# Patient Record
Sex: Male | Born: 1995 | Race: White | Hispanic: No | Marital: Single | Smoking: Never smoker
Health system: Southern US, Community
[De-identification: ages and names within clinical notes are randomized; demographics above are authoritative.]

## PROBLEM LIST (undated history)

## (undated) DIAGNOSIS — J45909 Unspecified asthma, uncomplicated: Secondary | ICD-10-CM

---
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
## 2021-12-26 ENCOUNTER — Ambulatory Visit: Payer: BC Managed Care – PPO | Admitting: Allergy

## 2021-12-26 ENCOUNTER — Encounter: Payer: Self-pay | Admitting: Allergy

## 2021-12-26 ENCOUNTER — Other Ambulatory Visit: Payer: Self-pay | Admitting: Allergy

## 2021-12-26 VITALS — BP 128/70 | HR 78 | Temp 98.9°F | Resp 16 | Ht 75.0 in | Wt 183.0 lb

## 2021-12-26 DIAGNOSIS — J4530 Mild persistent asthma, uncomplicated: Secondary | ICD-10-CM | POA: Diagnosis not present

## 2021-12-26 DIAGNOSIS — H1013 Acute atopic conjunctivitis, bilateral: Secondary | ICD-10-CM

## 2021-12-26 DIAGNOSIS — J3089 Other allergic rhinitis: Secondary | ICD-10-CM | POA: Diagnosis not present

## 2021-12-26 MED ORDER — CARBINOXAMINE MALEATE 6 MG PO TABS: 6.0000 mg | ORAL_TABLET | Freq: Two times a day (BID) | ORAL | 5 refills | Status: DC

## 2021-12-26 MED ORDER — ALBUTEROL SULFATE HFA 108 (90 BASE) MCG/ACT IN AERS: 2.0000 | INHALATION_SPRAY | RESPIRATORY_TRACT | 1 refills | Status: AC | PRN

## 2021-12-26 MED ORDER — OLOPATADINE HCL 0.2 % OP SOLN: 1.0000 [drp] | Freq: Every day | OPHTHALMIC | 5 refills | Status: DC | PRN

## 2021-12-26 MED ORDER — RYALTRIS 665-25 MCG/ACT NA SUSP: 2.0000 | Freq: Two times a day (BID) | NASAL | 5 refills | Status: DC | PRN

## 2021-12-26 NOTE — Progress Notes (Signed)
New Patient Note  RE: Alexander Boyle MRN: [id number] DOB: April 07, 1996 Date of Office Visit: 12/26/2021  Primary care provider: Patient, No Pcp Per  Chief Complaint: Allergies  History of present illness: Alexander Boyle is a 26 y.o. male presenting today for evaluation of possible allergic rhinoconjunctivitis.  He reports symptoms of itchy/watery eyes, nasal congestion, runny nose, throat clearing, sneezing and trouble breathing.  The trouble breathing he is not sure if it is his allergies or his asthma but reports some wheezing and some tightness feeling in throat.  Symptoms are year-round but worse in the spring and summer.  He has tried variety of OTC medications.  Zyrtec works the best out of the antihistamines he has tired.  Has tried eye drops that are over-the-counter.  He has not really used any nasal sprays.  He is currently not tried nasal saline rinses.  He was diagnosed with asthma as a child.  He denies any hospitalizations for asthma.  He states physical activity use to trigger symptoms as a child but not much any longer.  As above allergies can trigger symptoms.  He has not had an albuterol inhaler or any inhaler in about past 5 years.  He has had times where he would like to have had an inhaler to use.  States will just rest and take deep breaths when he is having asthma symptoms. He did use Qvar as a child for maintenance.  He also recalls taking a chewable tablet, likely singulair, as a child.    He does work in Furniture conservator/restorer and has sting only had a few responses and is in areas like crawlspace often.  No history of eczema or food allergy.   Review of systems in the past 4 weeks: Review of Systems  Constitutional: Negative.   HENT:  Positive for congestion, postnasal drip and sneezing.   Eyes:  Positive for itching.  Respiratory: Negative.    Cardiovascular: Negative.   Musculoskeletal: Negative.   Skin: Negative.   Allergic/Immunologic: Negative.    Neurological: Negative.     All other systems negative unless noted above in HPI  Past medical history: Past Medical History:  Diagnosis Date   Asthma     Past surgical history: History reviewed. No pertinent surgical history.  Family history:  History reviewed. No pertinent family history.  Social history: Lives in an apartment with carpeting in the family room with electric heating and central cooling.  Cats in the home.  No concern for water damage, mildew or roaches in the home.  He is a Database administrator.  He denies a smoking history.   Medication List: Current Outpatient Medications  Medication Sig Dispense Refill   cetirizine (ZYRTEC) 10 MG tablet Take 1 tablet (10 mg total) by mouth daily. 30 tablet 3   albuterol (PROVENTIL HFA;VENTOLIN HFA) 108 (90 BASE) MCG/ACT inhaler Inhale 2 puffs into the lungs every 6 (six) hours as needed for wheezing or shortness of breath. (Patient not taking: Reported on 12/26/2021) 1 Inhaler 0   beclomethasone (QVAR) 80 MCG/ACT inhaler Inhale 2 puffs into the lungs 2 (two) times daily. (Patient not taking: Reported on 12/26/2021) 1 Inhaler 2   No current facility-administered medications for this visit.    Known medication allergies: No Known Allergies   Physical examination: Blood pressure 128/70, pulse 78, temperature 98.9 F (37.2 C), resp. rate 16, height 6\' 3"  (1.905 m), weight 183 lb (83 kg), SpO2 96 %.  General: Alert, interactive, in no acute distress.  HEENT: PERRLA, TMs pearly gray, turbinates mildly edematous without discharge, post-pharynx non erythematous. Neck: Supple without lymphadenopathy. Lungs: Clear to auscultation without wheezing, rhonchi or rales. {no increased work of breathing. CV: Normal S1, S2 without murmurs. Abdomen: Nondistended, nontender. Skin: Warm and dry, without lesions or rashes. Extremities:  No clubbing, cyanosis or edema. Neuro:   Grossly intact.  Diagnositics/Labs: Spirometry: FEV1: 3.8 L  70%, FVC: 4.5 L 69% predicted.  Status post albuterol he had a 10% change in FEV1 to 4.17 L or 77% or 370 mL change which is significant however did not quite normalize  Allergy testing:   Airborne Adult Perc - 12/26/21 1400     Time Antigen Placed 1448    Allergen Manufacturer Lavella Hammock    Location Back    Number of Test 59    1. Control-Buffer 50% Glycerol Negative    2. Control-Histamine 1 mg/ml 2+    3. Albumin saline Negative    4. [place] Negative    5. [place] Negative    6. Johnson Negative    7. [place] Blue Negative    8. Meadow Fescue Negative    9. Perennial Rye Negative    10. Sweet Vernal Negative    11. Timothy Negative    12. Cocklebur Negative    13. Burweed Marshelder Negative    14. Ragweed, short Negative    15. Ragweed, Giant Negative    16. Plantain,  English Negative    17. Lamb's Quarters Negative    18. Sheep Sorrell Negative    19. Rough Pigweed Negative    20. Marsh Elder, Rough Negative    21. Mugwort, Common Negative    22. Ash mix Negative    23. Birch mix Negative    24. Beech American Negative    25. Box, Elder Negative    26. Cedar, red Negative    27. Cottonwood, [place] Negative    28. Elm mix Negative    29. Hickory Negative    30. Maple mix Negative    31. Oak, [place] mix Negative    32. Pecan Pollen Negative    33. Pine mix Negative    34. Sycamore Eastern Negative    35. [place], Black Pollen Negative    36. Alternaria alternata Negative    37. Cladosporium Herbarum Negative    38. Aspergillus mix Negative    39. Penicillium mix Negative    40. Bipolaris sorokiniana (Helminthosporium) Negative    41. Drechslera spicifera (Curvularia) Negative    42. Mucor plumbeus Negative    43. Fusarium moniliforme Negative    44. Aureobasidium pullulans (pullulara) Negative    45. Rhizopus oryzae Negative    46. Botrytis cinera Negative    47. Epicoccum nigrum Negative    48. Phoma betae Negative    49. Candida Albicans Negative    50.  Trichophyton mentagrophytes Negative    51. Mite, D Farinae  5,000 AU/ml Negative    52. Mite, D Pteronyssinus  5,000 AU/ml Negative    53. Cat Hair 10,000 BAU/ml Negative    54.  Dog Epithelia Negative    55. Mixed Feathers Negative    56. Horse Epithelia Negative    57. Cockroach, German Negative    58. Mouse Negative    59. Tobacco Leaf Negative             Intradermal - 12/26/21 1521     Time Antigen Placed 1520    Allergen Manufacturer Lavella Hammock    Location Arm  Number of Test 15    Control Negative    [place] Negative    Johnson Negative    7 Grass Negative    Ragweed mix 2+    Weed mix 2+    Tree mix 2+    Mold 1 Negative    Mold 2 2+    Mold 3 2+    Mold 4 2+    Cat 4+    Dog 4+    Cockroach 3+    Mite mix 4+             Allergy testing results were read and interpreted by provider, documented by clinical staff.   Assessment and plan: Allergic rhinitis with conjunctivitis  - Testing today showed: weeds, trees, indoor molds, outdoor molds, dust mites, cat, dog, and cockroach. - Copy of test results provided.  - Avoidance measures provided. - Stop taking: your allergy based antihistamines - Start taking: Ryvent (carbinoxamine) 6mg  tablet 2 times daily.  This is a prescription based antihistamine.   Ryaltris (olopatadine/mometasone) two sprays per nostril 1-2 times daily for nasal congestion or drainage control.  Sample provided.  Pataday (olopatadine) one drop per eye daily as needed for itchy/watery eyes.  - You can use an extra dose of the antihistamine, if needed, for breakthrough symptoms.  - Consider nasal saline rinses 1-2 times daily to remove allergens from the nasal cavities as well as help with mucous clearance (this is especially helpful to do before the nasal sprays are given) - Consider allergy shots as a means of long-term control if medication management is not effective enough. - Allergy shots "re-train" and "reset" the immune system to  ignore environmental allergens and decrease the resulting immune response to those allergens (sneezing, itchy watery eyes, runny nose, nasal congestion, etc).    - Allergy shots improve symptoms in 80-85% of patients.   Mild persistent asthma - Daily controller medication(s): none at this time - Prior to physical activity: albuterol 2 puffs 10-15 minutes before physical activity. - Rescue medications: albuterol 2 puffs every 4-6 hours as needed  - Asthma control goals:  * Full participation in all desired activities (may need albuterol before activity) * Albuterol use two time or less a week on average (not counting use with activity) * Cough interfering with sleep two time or less a month * Oral steroids no more than once a year * No hospitalizations  Follow-up in 3-4 months or sooner if needed  I appreciate the opportunity to take part in Ray[place]'s care. Please do not hesitate to contact me with questions.  Sincerely,   03-10-1987, MD Allergy/Immunology [facility]

## 2021-12-26 NOTE — Patient Instructions (Signed)
-   Testing today showed: weeds, trees, indoor molds, outdoor molds, dust mites, cat, dog, and cockroach. - Copy of test results provided.  - Avoidance measures provided. - Stop taking: your allergy based antihistamines - Start taking: Ryvent (carbinoxamine) 6mg  tablet 2 times daily.  This is a prescription based antihistamine.   Ryaltris (olopatadine/mometasone) two sprays per nostril 1-2 times daily for nasal congestion or drainage control.  Sample provided.  Pataday (olopatadine) one drop per eye daily as needed for itchy/watery eyes.  - You can use an extra dose of the antihistamine, if needed, for breakthrough symptoms.  - Consider nasal saline rinses 1-2 times daily to remove allergens from the nasal cavities as well as help with mucous clearance (this is especially helpful to do before the nasal sprays are given) - Consider allergy shots as a means of long-term control if medication management is not effective enough. - Allergy shots "re-train" and "reset" the immune system to ignore environmental allergens and decrease the resulting immune response to those allergens (sneezing, itchy watery eyes, runny nose, nasal congestion, etc).    - Allergy shots improve symptoms in 80-85% of patients.   - Daily controller medication(s): none at this time - Prior to physical activity: albuterol 2 puffs 10-15 minutes before physical activity. - Rescue medications: albuterol 2 puffs every 4-6 hours as needed  - Asthma control goals:  * Full participation in all desired activities (may need albuterol before activity) * Albuterol use two time or less a week on average (not counting use with activity) * Cough interfering with sleep two time or less a month * Oral steroids no more than once a year * No hospitalizations  Follow-up in 3-4 months or sooner if needed

## 2022-04-26 ENCOUNTER — Ambulatory Visit: Payer: BC Managed Care – PPO | Admitting: Allergy

## 2022-04-26 DIAGNOSIS — J309 Allergic rhinitis, unspecified: Secondary | ICD-10-CM

## 2022-04-29 ENCOUNTER — Other Ambulatory Visit: Payer: Self-pay | Admitting: Chiropractic Medicine

## 2022-04-29 ENCOUNTER — Ambulatory Visit
Admission: RE | Admit: 2022-04-29 | Discharge: 2022-04-29 | Disposition: A | Discharge status: Home / Self Care | Payer: BC Managed Care – PPO | Source: Ambulatory Visit | Attending: Chiropractic Medicine | Admitting: Chiropractic Medicine

## 2022-04-29 DIAGNOSIS — M542 Cervicalgia: Secondary | ICD-10-CM

## 2022-04-29 DIAGNOSIS — M549 Dorsalgia, unspecified: Secondary | ICD-10-CM

## 2022-06-07 ENCOUNTER — Encounter: Payer: Self-pay | Admitting: Allergy

## 2022-06-07 ENCOUNTER — Ambulatory Visit: Payer: BC Managed Care – PPO | Admitting: Allergy

## 2022-06-07 ENCOUNTER — Other Ambulatory Visit: Payer: Self-pay

## 2022-06-07 VITALS — BP 108/64 | HR 64 | Temp 98.1°F | Resp 16

## 2022-06-07 DIAGNOSIS — J4530 Mild persistent asthma, uncomplicated: Secondary | ICD-10-CM | POA: Diagnosis not present

## 2022-06-07 DIAGNOSIS — H1013 Acute atopic conjunctivitis, bilateral: Secondary | ICD-10-CM

## 2022-06-07 DIAGNOSIS — J3089 Other allergic rhinitis: Secondary | ICD-10-CM | POA: Diagnosis not present

## 2022-06-07 MED ORDER — VENTOLIN HFA 108 (90 BASE) MCG/ACT IN AERS: 2.0000 | INHALATION_SPRAY | RESPIRATORY_TRACT | 1 refills | Status: DC | PRN

## 2022-06-07 MED ORDER — CARBINOXAMINE MALEATE 4 MG PO TABS: 1.0000 | ORAL_TABLET | Freq: Two times a day (BID) | ORAL | 5 refills | Status: DC

## 2022-06-07 MED ORDER — OLOPATADINE HCL 0.2 % OP SOLN: 1.0000 [drp] | Freq: Every day | OPHTHALMIC | 5 refills | Status: DC | PRN

## 2022-06-07 MED ORDER — RYALTRIS 665-25 MCG/ACT NA SUSP: 2.0000 | Freq: Two times a day (BID) | NASAL | 5 refills | Status: DC | PRN

## 2022-06-07 NOTE — Patient Instructions (Signed)
-  Continue avoidance measures for weeds, trees, indoor molds, outdoor molds, dust mites, cat, dog, and cockroach. - Start taking: Ryvent (carbinoxamine) 6mg  tablet 2 times daily.  This is a prescription based antihistamine.   Ryaltris (olopatadine/mometasone) two sprays per nostril 1-2 times daily for nasal congestion or drainage control.  Sample provided.  Pataday (olopatadine) one drop per eye daily as needed for itchy/watery eyes.  - You can use an extra dose of the antihistamine, if needed, for breakthrough symptoms.  - Consider nasal saline rinses 1-2 times daily to remove allergens from the nasal cavities as well as help with mucous clearance (this is especially helpful to do before the nasal sprays are given) - Consider allergy shots as a means of long-term control if medication management is not effective enough. - Allergy shots "re-train" and "reset" the immune system to ignore environmental allergens and decrease the resulting immune response to those allergens (sneezing, itchy watery eyes, runny nose, nasal congestion, etc).    - Allergy shots improve symptoms in 80-85% of patients.   - Daily controller medication(s): none at this time - Prior to physical activity: albuterol 2 puffs 10-15 minutes before physical activity. - Rescue medications: albuterol 2 puffs every 4-6 hours as needed  - Asthma control goals:  * Full participation in all desired activities (may need albuterol before activity) * Albuterol use two time or less a week on average (not counting use with activity) * Cough interfering with sleep two time or less a month * Oral steroids no more than once a year * No hospitalizations  Follow-up in 6 months or sooner if needed

## 2022-06-07 NOTE — Progress Notes (Signed)
Follow-up Note  RE: Alexander Boyle MRN: [id number] DOB: 1996-03-03 Date of Office Visit: 06/07/2022   History of present illness: Alexander Boyle is a 27 y.o. male presenting today for follow-up of allergic rhinitis with conjunctivitis and asthma.  He was last seen in the office on 12/26/21 by myself.  After this visit we sent in the medications to pharmacy however there was delay in getting them and thus appears pharmacy restocked the prescriptions.  Thus he has not had them to try thus far.  I had recommended use of ruvent primarily.  We were able to get him a sample of the ryaltris which he did use the sample and states it did help and he would wake up with less congestion.  The sample ran out.  Also recommended pataday for as needed use but he states has not had a lot of itchy/watery eyes.  With his asthma he states he only had 1 episodes since last visit where he had shortness of breath and he just slowed down what every he was doing to catch his breath and in several minutes he had some resolution.  He states he does not have albuterol.  He has not had any ED/UC visits or systemic steroid needs.   Review of systems: Review of Systems  Constitutional: Negative.   HENT: Negative.    Eyes: Negative.   Respiratory: Negative.    Cardiovascular: Negative.   Musculoskeletal: Negative.   Skin: Negative.   Allergic/Immunologic: Negative.   Neurological: Negative.      All other systems negative unless noted above in HPI  Past medical/social/surgical/family history have been reviewed and are unchanged unless specifically indicated below.  No changes  Medication List: Current Outpatient Medications  Medication Sig Dispense Refill   VENTOLIN HFA 108 (90 Base) MCG/ACT inhaler Inhale 2 puffs into the lungs every 4 (four) hours as needed for wheezing or shortness of breath. 18 g 1   albuterol (VENTOLIN HFA) 108 (90 Base) MCG/ACT inhaler Inhale 2 puffs into the lungs every 4 (four) hours  as needed for wheezing or shortness of breath. (Patient not taking: Reported on 06/07/2022) 18 g 1   beclomethasone (QVAR) 80 MCG/ACT inhaler Inhale 2 puffs into the lungs 2 (two) times daily. (Patient not taking: Reported on 12/26/2021) 1 Inhaler 2   Carbinoxamine Maleate 4 MG TABS Take 1 tablet (4 mg total) by mouth 2 (two) times daily. 60 tablet 5   Olopatadine HCl 0.2 % SOLN Apply 1 drop to eye daily as needed (Itchy or watery eyes). 2.5 mL 5   RYALTRIS 665-25 MCG/ACT SUSP Place 2 sprays into the nose 2 (two) times daily as needed (Runny or stuffy nose). 29 g 5   No current facility-administered medications for this visit.     Known medication allergies: No Known Allergies   Physical examination: Blood pressure 108/64, pulse 64, temperature 98.1 F (36.7 C), temperature source Temporal, resp. rate 16, SpO2 95 %.  General: Alert, interactive, in no acute distress. HEENT: PERRLA, TMs pearly gray, turbinates moderately edematous without discharge, post-pharynx non erythematous. Neck: Supple without lymphadenopathy. Lungs: Clear to auscultation without wheezing, rhonchi or rales. {no increased work of breathing. CV: Normal S1, S2 without murmurs. Abdomen: Nondistended, nontender. Skin: Warm and dry, without lesions or rashes. Extremities:  No clubbing, cyanosis or edema. Neuro:   Grossly intact.  Diagnositics/Labs: None today  Assessment and plan: Allergic rhinitis with conjunctivitis - Continue avoidance measures for weeds, trees, indoor molds, outdoor molds, dust  mites, cat, dog, and cockroach. - Start taking: Ryvent (carbinoxamine) 6mg  tablet 2 times daily.  This is a prescription based antihistamine.   Ryaltris (olopatadine/mometasone) two sprays per nostril 1-2 times daily for nasal congestion or drainage control.  Sample provided.  Pataday (olopatadine) one drop per eye daily as needed for itchy/watery eyes.  - You can use an extra dose of the antihistamine, if needed, for  breakthrough symptoms.  - Consider nasal saline rinses 1-2 times daily to remove allergens from the nasal cavities as well as help with mucous clearance (this is especially helpful to do before the nasal sprays are given) - Consider allergy shots as a means of long-term control if medication management is not effective enough. - Allergy shots "re-train" and "reset" the immune system to ignore environmental allergens and decrease the resulting immune response to those allergens (sneezing, itchy watery eyes, runny nose, nasal congestion, etc).    - Allergy shots improve symptoms in 80-85% of patients.   Mild persistent asthma - Daily controller medication(s): none at this time - Prior to physical activity: albuterol 2 puffs 10-15 minutes before physical activity. - Rescue medications: albuterol 2 puffs every 4-6 hours as needed  - Asthma control goals:  * Full participation in all desired activities (may need albuterol before activity) * Albuterol use two time or less a week on average (not counting use with activity) * Cough interfering with sleep two time or less a month * Oral steroids no more than once a year * No hospitalizations  Follow-up in 6 months or sooner if needed  I appreciate the opportunity to take part in Alexander Boyle's care. Please do not hesitate to contact me with questions.  Sincerely,   Prudy Feeler, MD Allergy/Immunology [facility]

## 2022-12-06 ENCOUNTER — Ambulatory Visit: Payer: BC Managed Care – PPO | Admitting: Allergy

## 2023-02-22 ENCOUNTER — Other Ambulatory Visit: Payer: Self-pay | Admitting: Allergy

## 2023-05-26 NOTE — Patient Instructions (Addendum)
 Allergic rhinitis with conjunctivitis - Your rapid Covid-19 test today is negative. Discussed how symptoms are likely viral at this point with symptoms starting Sunday. If symptoms worsen or if you develop a fever let us  know. If sore throat persists/worsens recommend going to urgent care for strep throat testing - Continue avoidance measures for weeds, trees, indoor molds, outdoor molds, dust mites, cat, dog, and cockroach. - continue  (carbinoxamine ) 4mg  tablet 2 times daily.  This is a prescription based antihistamine.    -Start Flonase  (fluticasone ) 1-2 sprays in each nostril once a day as needed for stuffy nose. If insurance does not cover this you can buy it over the counter - Start azelastine  nasal spray 1-2 sprays in each nostril once to twice a day as needed for drainage down throat/runny nose. If insurance does not cover this medication you can buy it over the counter Pataday  (olopatadine ) one drop per eye daily as needed for itchy/watery eyes.  - You can use an extra dose of the antihistamine, if needed, for breakthrough symptoms.  - Consider nasal saline rinses 1-2 times daily to remove allergens from the nasal cavities as well as help with mucous clearance (this is especially helpful to do before the nasal sprays are given) - Consider allergy  shots as a means of long-term control if medication management is not effective enough. - Allergy  shots re-train and reset the immune system to ignore environmental allergens and decrease the resulting immune response to those allergens (sneezing, itchy watery eyes, runny nose, nasal congestion, etc).    - Allergy  shots improve symptoms in 80-85% of patients.   Mild persistent asthma - Daily controller medication(s): none at this time - Prior to physical activity: albuterol  2 puffs 10-15 minutes before physical activity. - Rescue medications: albuterol  2 puffs every 4-6 hours as needed  - Asthma control goals:  * Full participation in all  desired activities (may need albuterol  before activity) * Albuterol  use two time or less a week on average (not counting use with activity) * Cough interfering with sleep two time or less a month * Oral steroids no more than once a year * No hospitalizations  Follow-up in 4-6 months or sooner if needed

## 2023-05-27 ENCOUNTER — Ambulatory Visit: Payer: BC Managed Care – PPO | Admitting: Family

## 2023-05-27 ENCOUNTER — Other Ambulatory Visit: Payer: Self-pay

## 2023-05-27 ENCOUNTER — Encounter: Payer: Self-pay | Admitting: Family

## 2023-05-27 VITALS — BP 120/60 | HR 72 | Temp 98.6°F | Resp 16 | Ht 73.0 in | Wt 184.4 lb

## 2023-05-27 DIAGNOSIS — J3089 Other allergic rhinitis: Secondary | ICD-10-CM

## 2023-05-27 DIAGNOSIS — J4530 Mild persistent asthma, uncomplicated: Secondary | ICD-10-CM | POA: Diagnosis not present

## 2023-05-27 DIAGNOSIS — R52 Pain, unspecified: Secondary | ICD-10-CM

## 2023-05-27 DIAGNOSIS — R0981 Nasal congestion: Secondary | ICD-10-CM

## 2023-05-27 DIAGNOSIS — H1013 Acute atopic conjunctivitis, bilateral: Secondary | ICD-10-CM

## 2023-05-27 MED ORDER — VENTOLIN HFA 108 (90 BASE) MCG/ACT IN AERS: 2.0000 | INHALATION_SPRAY | RESPIRATORY_TRACT | 1 refills | Status: AC | PRN

## 2023-05-27 MED ORDER — FLUTICASONE PROPIONATE 50 MCG/ACT NA SUSP: NASAL | 5 refills | Status: AC

## 2023-05-27 MED ORDER — AZELASTINE HCL 0.15 % NA SOLN: NASAL | 2 refills | Status: AC

## 2023-05-27 MED ORDER — CARBINOXAMINE MALEATE 4 MG PO TABS: 1.0000 | ORAL_TABLET | Freq: Two times a day (BID) | ORAL | 5 refills | Status: DC

## 2023-05-27 MED ORDER — OLOPATADINE HCL 0.2 % OP SOLN: 1.0000 [drp] | Freq: Every day | OPHTHALMIC | 5 refills | Status: AC | PRN

## 2023-05-27 NOTE — Progress Notes (Signed)
 [address] [place] [address] Dept: [phone]  FOLLOW UP NOTE  Patient ID: Alexander Boyle, male    DOB: 12-Apr-1996  Age: 28 y.o. MRN: [id number] Date of Office Visit: 05/27/2023  Assessment  Chief Complaint: Follow-up (Only has to use in inhaler for 1 to 2 times in the last 6 months. States he has a sinus infection currently.) and Medication Refill  HPI Alexander Boyle is a 28 year old male who presents today for follow-up of allergic rhinitis with conjunctivitis and mild persistent asthma.  He was last seen on June 07, 2022 by Dr. Jeneal.  He denies any new diagnosis or surgeries since his last office visit.  His wife is here with him today.  Allergic rhinitis with conjunctivitis: He reports since Sunday he has had stuffy nose, sore throat, sneezing, body aches, and a cough due to feeling something in his throat.  He has not been treated for any sinus infections since we last saw him.  He also reports chills.  His highest fever was this morning at 99 F.  He denies rhinorrhea and postnasal drip.  He has not checked for COVID-19.  He does not have any known sick contacts.  He wonders if he has a sinus infection.  He is currently using carbinoxamine  as needed does not have any nose sprays.  He was just given a sample of the Ryaltris  nasal spray.  He reports itchy watery eyes at times.  He only wears glasses.  Mild persistent asthma: He reports a dry cough due to something in his throat.  He denies wheezing, tightness in his chest, and shortness of breath.  He also reports not being able to breathe at night due to his nose being stopped up.  Since his last office visit he has not required any systemic steroids or made any trips to the emergency room or urgent care due to breathing problems.  He reports very rare use of his albuterol .   Drug Allergies:  No Known Allergies  Review of Systems: Negative except as per HPI   Physical Exam: BP 120/60   Pulse 72   Temp 98.6 F  (37 C) (Axillary)   Resp 16   Ht 6' 1 (1.854 m)   Wt 184 lb 6.4 oz (83.6 kg)   SpO2 96%   BMI 24.33 kg/m    Physical Exam Chaperone present: wife present.  Constitutional:      Appearance: Normal appearance.  HENT:     Head: Normocephalic and atraumatic.     Comments: Pharynx normal. Eyes normal. Ears normal. Nose: bilateral lower turbinates mildly edematous with no drainage noted.    Right Ear: Tympanic membrane, ear canal and external ear normal.     Left Ear: Tympanic membrane, ear canal and external ear normal.     Mouth/Throat:     Mouth: Mucous membranes are moist.     Pharynx: Oropharynx is clear.  Eyes:     Conjunctiva/sclera: Conjunctivae normal.  Cardiovascular:     Rate and Rhythm: Regular rhythm.     Heart sounds: Normal heart sounds.  Pulmonary:     Effort: Pulmonary effort is normal.     Breath sounds: Normal breath sounds.     Comments: Lungs clear to auscultation Musculoskeletal:     Cervical back: Neck supple.  Skin:    General: Skin is warm.  Neurological:     Mental Status: He is alert and oriented to person, place, and time.  Psychiatric:  Mood and Affect: Mood normal.        Behavior: Behavior normal.        Thought Content: Thought content normal.        Judgment: Judgment normal.     Diagnostics: Your rapid Covid-19 test today was negative  Will get spirometry at next office visit due to symptoms  Assessment and Plan: 1. Non-seasonal allergic rhinitis due to other allergic trigger   2. Mild persistent asthma, uncomplicated   3. Allergic conjunctivitis of both eyes   4. Body aches   5. Nasal congestion     Meds ordered this encounter  Medications   VENTOLIN  HFA 108 (90 Base) MCG/ACT inhaler    Sig: Inhale 2 puffs into the lungs every 4 (four) hours as needed for wheezing or shortness of breath.    Dispense:  18 g    Refill:  1   Olopatadine  HCl 0.2 % SOLN    Sig: Apply 1 drop to eye daily as needed (Itchy or watery eyes).     Dispense:  2.5 mL    Refill:  5   Carbinoxamine  Maleate 4 MG TABS    Sig: Take 1 tablet (4 mg total) by mouth 2 (two) times daily.    Dispense:  60 tablet    Refill:  5   Azelastine  HCl 0.15 % SOLN    Sig: Use 1-2 sprays in each nostril once to twice a day as needed for runny nose/drainage down throat.    Dispense:  30 mL    Refill:  2   fluticasone  (FLONASE ) 50 MCG/ACT nasal spray    Sig: Place 1-2 sprays in each nostril once a day as needed for stuffy nose    Dispense:  16 g    Refill:  5    Patient Instructions  Allergic rhinitis with conjunctivitis - Your rapid Covid-19 test today is negative. Discussed how symptoms are likely viral at this point with symptoms starting Sunday. If symptoms worsen or if you develop a fever let us  know. If sore throat persists/worsens recommend going to urgent care for strep throat testing - Continue avoidance measures for weeds, trees, indoor molds, outdoor molds, dust mites, cat, dog, and cockroach. - continue  (carbinoxamine ) 4mg  tablet 2 times daily.  This is a prescription based antihistamine.    -Start Flonase  (fluticasone ) 1-2 sprays in each nostril once a day as needed for stuffy nose. If insurance does not cover this you can buy it over the counter - Start azelastine  nasal spray 1-2 sprays in each nostril once to twice a day as needed for drainage down throat/runny nose. If insurance does not cover this medication you can buy it over the counter Pataday  (olopatadine ) one drop per eye daily as needed for itchy/watery eyes.  - You can use an extra dose of the antihistamine, if needed, for breakthrough symptoms.  - Consider nasal saline rinses 1-2 times daily to remove allergens from the nasal cavities as well as help with mucous clearance (this is especially helpful to do before the nasal sprays are given) - Consider allergy  shots as a means of long-term control if medication management is not effective enough. - Allergy  shots re-train and  reset the immune system to ignore environmental allergens and decrease the resulting immune response to those allergens (sneezing, itchy watery eyes, runny nose, nasal congestion, etc).    - Allergy  shots improve symptoms in 80-85% of patients.   Mild persistent asthma - Daily controller medication(s): none at this time -  Prior to physical activity: albuterol  2 puffs 10-15 minutes before physical activity. - Rescue medications: albuterol  2 puffs every 4-6 hours as needed  - Asthma control goals:  * Full participation in all desired activities (may need albuterol  before activity) * Albuterol  use two time or less a week on average (not counting use with activity) * Cough interfering with sleep two time or less a month * Oral steroids no more than once a year * No hospitalizations  Follow-up in 4-6 months or sooner if needed  Return in about 6 months (around 11/24/2023), or if symptoms worsen or fail to improve.    Thank you for the opportunity to care for this patient.  Please do not hesitate to contact me with questions.  Wanda Craze, FNP [facility] 

## 2023-11-24 ENCOUNTER — Ambulatory Visit: Payer: BC Managed Care – PPO | Admitting: Family Medicine

## 2023-12-03 NOTE — Progress Notes (Signed)
 [address] [place] [address] Dept: [phone]  FOLLOW UP NOTE  Patient ID: Alexander Boyle, male    DOB: 1995/11/28  Age: 28 y.o. MRN: [id number] Date of Office Visit: 12/04/2023  Assessment  Chief Complaint: Allergic Rhinitis   HPI Alexander Boyle is a 28 year old male who presents to the clinic for a follow-up visit.  He was last seen in this clinic on 05/27/2023 by Wanda Craze, FNP, for evaluation of asthma, allergic rhinitis, and allergic conjunctivitis.  His his last environmental allergy  skin testing on 12/26/2021 was positive to weed pollen, tree pollen, indoor mold, outdoor mold, dust mite, cat, dog, and cockroach.  At today's visit, he reports his asthma has been well-controlled with occasional shortness of breath occurring mainly when the pollen is heavy in the springtime.  He continues albuterol  as needed and reports that he has not used this medication for about 2 months.  He reports relief of symptoms with albuterol  use.  Allergic rhinitis is reported as moderately well-controlled with occasional symptoms  including nasal congestion and occasional rhinorrhea.  He continues carbinoxamine  4 mg twice a day and occasionally uses Flonase  and azelastine  with relief of symptoms.  He reports his allergies are aggravated in the springtime.  We briefly discussed allergy  injections and he reports that he feels satisfied with his current medication regimen.  Allergic conjunctivitis is reported as well-controlled with occasional red and itchy eyes for which he uses an over-the-counter allergy  eyedrop if needed with relief of symptoms.  His current medications are listed in the chart.  Drug Allergies:  No Known Allergies  Physical Exam: BP 118/62 (BP Location: Left Arm, Patient Position: Sitting, Cuff Size: Normal)   Pulse 68   Temp 98.5 F (36.9 C) (Temporal)   Resp 16   SpO2 96%    Physical Exam Vitals reviewed.  Constitutional:      Appearance: Normal appearance.   HENT:     Head: Normocephalic and atraumatic.     Right Ear: Tympanic membrane normal.     Left Ear: Tympanic membrane normal.     Nose:     Comments: Bilateral nares slightly erythematous with thin clear nasal drainage noted.  Pharynx normal.  Ears normal.  Eyes normal.    Mouth/Throat:     Pharynx: Oropharynx is clear.  Eyes:     Conjunctiva/sclera: Conjunctivae normal.  Cardiovascular:     Rate and Rhythm: Normal rate and regular rhythm.     Heart sounds: Normal heart sounds. No murmur heard. Pulmonary:     Effort: Pulmonary effort is normal.     Breath sounds: Normal breath sounds.     Comments: Lungs clear to auscultation Musculoskeletal:        General: Normal range of motion.     Cervical back: Normal range of motion and neck supple.  Skin:    General: Skin is warm and dry.  Neurological:     Mental Status: He is alert and oriented to person, place, and time.  Psychiatric:        Mood and Affect: Mood normal.        Behavior: Behavior normal.        Thought Content: Thought content normal.        Judgment: Judgment normal.     Diagnostics: FVC 5.38 which is 82% of predicted value, FEV1 3.99 which is 74% of predicted value.  Spirometry indicates normal ventilatory function with slightly reduced FEV1  Assessment and Plan: 1. Mild persistent asthma,  uncomplicated   2. Seasonal and perennial allergic rhinitis   3. Allergic conjunctivitis of both eyes     Meds ordered this encounter  Medications   Carbinoxamine  Maleate 4 MG TABS    Sig: Take 1 tablet (4 mg total) by mouth 2 (two) times daily.    Dispense:  60 tablet    Refill:  5    Patient Instructions  Asthma  Continue albuterol  2 puffs once every 4 hours if needed for cough or wheeze  You may use albuterol  2 puffs 5 to 15 minutes before activity to decrease cough or wheeze   Allergic rhinitis Continue allergen avoidance measures directed toward weed pollen, tree pollen, indoor mold, outdoor mold, dust  mite, cat, dog, and cockroach as listed below Continue carbinoxamine  4 mg tablets.  You may take 1 to 2 tablets once or twice a day if needed for nasal symptoms Continue Flonase  1 to 2 sprays in each nostril once a day if needed for stuffy nose Continue azelastine  2 sprays in each nostril up to twice a day if needed for stuffy nose Consider saline nasal rinses as needed for nasal symptoms. Use this before any medicated nasal sprays for best result Consider allergen immunotherapy if your symptoms are not well-controlled with the treatment plan as listed above  Allergic conjunctivitis Some over the counter eye drops include Pataday  one drop in each eye once a day as needed for red, itchy eyes OR Zaditor one drop in each eye twice a day as needed for red itchy eyes. Avoid eye drops that say red eye relief as they may contain medications that dry out your eyes.   Call the clinic if this treatment plan is not working well for you  Follow up in 1 year or sooner if needed.   Return in about 1 year (around 12/03/2024), or if symptoms worsen or fail to improve.    Thank you for the opportunity to care for this patient.  Please do not hesitate to contact me with questions.  Arlean Mutter, FNP [facility] 

## 2023-12-03 NOTE — Patient Instructions (Signed)
 Asthma  Continue albuterol  2 puffs once every 4 hours if needed for cough or wheeze  You may use albuterol  2 puffs 5 to 15 minutes before activity to decrease cough or wheeze   Allergic rhinitis Continue allergen avoidance measures directed toward weed pollen, tree pollen, indoor mold, outdoor mold, dust mite, cat, dog, and cockroach as listed below Continue carbinoxamine  4 mg tablets.  You may take 1 to 2 tablets once or twice a day if needed for nasal symptoms Continue Flonase  1 to 2 sprays in each nostril once a day if needed for stuffy nose Continue azelastine  2 sprays in each nostril up to twice a day if needed for stuffy nose Consider saline nasal rinses as needed for nasal symptoms. Use this before any medicated nasal sprays for best result Consider allergen immunotherapy if your symptoms are not well-controlled with the treatment plan as listed above  Allergic conjunctivitis Some over the counter eye drops include Pataday  one drop in each eye once a day as needed for red, itchy eyes OR Zaditor one drop in each eye twice a day as needed for red itchy eyes. Avoid eye drops that say red eye relief as they may contain medications that dry out your eyes.   Call the clinic if this treatment plan is not working well for you  Follow up in 1 year or sooner if needed.  Reducing Pollen Exposure The American Academy of Allergy , Asthma and Immunology suggests the following steps to reduce your exposure to pollen during allergy  seasons. Do not hang sheets or clothing out to dry; pollen may collect on these items. Do not mow lawns or spend time around freshly cut grass; mowing stirs up pollen. Keep windows closed at night.  Keep car windows closed while driving. Minimize morning activities outdoors, a time when pollen counts are usually at their highest. Stay indoors as much as possible when pollen counts or humidity is high and on windy days when pollen tends to remain in the air longer. Use air  conditioning when possible.  Many air conditioners have filters that trap the pollen spores. Use a HEPA room air filter to remove pollen form the indoor air you breathe.  Control of Mold Allergen Mold and fungi can grow on a variety of surfaces provided certain temperature and moisture conditions exist.  Outdoor molds grow on plants, decaying vegetation and soil.  The major outdoor mold, Alternaria and Cladosporium, are found in very high numbers during hot and dry conditions.  Generally, a late Summer - Fall peak is seen for common outdoor fungal spores.  Rain will temporarily lower outdoor mold spore count, but counts rise rapidly when the rainy period ends.  The most important indoor molds are Aspergillus and Penicillium.  Dark, humid and poorly ventilated basements are ideal sites for mold growth.  The next most common sites of mold growth are the bathroom and the kitchen.  Outdoor [employer] Use air conditioning and keep windows closed Avoid exposure to decaying vegetation. Avoid leaf raking. Avoid grain handling. Consider wearing a face mask if working in moldy areas.  Indoor Mold Control Maintain humidity below 50%. Clean washable surfaces with 5% bleach solution. Remove sources e.g. Contaminated carpets.   Control of Dust Mite Allergen Dust mites play a major role in allergic asthma and rhinitis. They occur in environments with high humidity wherever human skin is found. Dust mites absorb humidity from the atmosphere (ie, they do not drink) and feed on organic matter (including shed human  and animal skin). Dust mites are a microscopic type of insect that you cannot see with the naked eye. High levels of dust mites have been detected from mattresses, pillows, carpets, upholstered furniture, bed covers, clothes, soft toys and any woven material. The principal allergen of the dust mite is found in its feces. A gram of dust may contain 1,000 mites and 250,000 fecal particles. Mite antigen  is easily measured in the air during house cleaning activities. Dust mites do not bite and do not cause harm to humans, other than by triggering allergies/asthma.  Ways to decrease your exposure to dust mites in your home:  1. Encase mattresses, box springs and pillows with a mite-impermeable barrier or cover  2. Wash sheets, blankets and drapes weekly in hot water (130 F) with detergent and dry them in a dryer on the hot setting.  3. Have the room cleaned frequently with a vacuum cleaner and a damp dust-mop. For carpeting or rugs, vacuuming with a vacuum cleaner equipped with a high-efficiency particulate air (HEPA) filter. The dust mite allergic individual should not be in a room which is being cleaned and should wait 1 hour after cleaning before going into the room.  4. Do not sleep on upholstered furniture (eg, couches).  5. If possible removing carpeting, upholstered furniture and drapery from the home is ideal. Horizontal blinds should be eliminated in the rooms where the person spends the most time (bedroom, study, television room). Washable vinyl, roller-type shades are optimal.  6. Remove all non-washable stuffed toys from the bedroom. Wash stuffed toys weekly like sheets and blankets above.  7. Reduce indoor humidity to less than 50%. Inexpensive humidity monitors can be purchased at most hardware stores. Do not use a humidifier as can make the problem worse and are not recommended.  Control of Dog or Cat Allergen Avoidance is the best way to manage a dog or cat allergy . If you have a dog or cat and are allergic to dog or cats, consider removing the dog or cat from the home. If you have a dog or cat but don't want to find it a new home, or if your family wants a pet even though someone in the household is allergic, here are some strategies that may help keep symptoms at bay:  Keep the pet out of your bedroom and restrict it to only a few rooms. Be advised that keeping the dog or cat  in only one room will not limit the allergens to that room. Don't pet, hug or kiss the dog or cat; if you do, wash your hands with soap and water. High-efficiency particulate air (HEPA) cleaners run continuously in a bedroom or living room can reduce allergen levels over time. Regular use of a high-efficiency vacuum cleaner or a central vacuum can reduce allergen levels. Giving your dog or cat a bath at least once a week can reduce airborne allergen.  Control of Cockroach Allergen Cockroach allergen has been identified as an important cause of acute attacks of asthma, especially in urban settings.  There are fifty-five species of cockroach that exist in the [place] , however only three, the [place], [place] and [place] species produce allergen that can affect patients with Asthma.  Allergens can be obtained from fecal particles, egg casings and secretions from cockroaches.    Remove food sources. Reduce access to water. Seal access and entry points. Spray runways with 0.5-1% Diazinon or Chlorpyrifos Blow boric acid power under stoves and refrigerator. Place bait stations (hydramethylnon)  at feeding sites.

## 2023-12-04 ENCOUNTER — Other Ambulatory Visit: Payer: Self-pay

## 2023-12-04 ENCOUNTER — Ambulatory Visit: Admitting: Family Medicine

## 2023-12-04 ENCOUNTER — Encounter: Payer: Self-pay | Admitting: Family Medicine

## 2023-12-04 VITALS — BP 118/62 | HR 68 | Temp 98.5°F | Resp 16

## 2023-12-04 DIAGNOSIS — H1013 Acute atopic conjunctivitis, bilateral: Secondary | ICD-10-CM

## 2023-12-04 DIAGNOSIS — J302 Other seasonal allergic rhinitis: Secondary | ICD-10-CM | POA: Diagnosis not present

## 2023-12-04 DIAGNOSIS — J4530 Mild persistent asthma, uncomplicated: Secondary | ICD-10-CM | POA: Diagnosis not present

## 2023-12-04 DIAGNOSIS — J3089 Other allergic rhinitis: Secondary | ICD-10-CM | POA: Diagnosis not present

## 2023-12-04 MED ORDER — CARBINOXAMINE MALEATE 4 MG PO TABS: 1.0000 | ORAL_TABLET | Freq: Two times a day (BID) | ORAL | 5 refills | Status: AC

## 2024-12-03 ENCOUNTER — Ambulatory Visit: Admitting: Allergy
